# Patient Record
Sex: Female | Born: 2000 | Race: Black or African American | Hispanic: No | Marital: Single | Smoking: Never smoker
Health system: Southern US, Community
[De-identification: ages and names within clinical notes are randomized; demographics above are authoritative.]

## PROBLEM LIST (undated history)

## (undated) HISTORY — PX: UMBILICAL HERNIA REPAIR: SUR1181

---
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
## 2019-01-30 IMAGING — US US RENAL
1 series · 14 of 25 positions shown · non-contrast
Comparison: None.

CLINICAL DATA: Fevers, possible pyelonephritis

EXAM:
RENAL / URINARY TRACT ULTRASOUND COMPLETE

[Series 1: us renal · 0.24mm/px · 14 of 39 slices shown]
[im 1/39]
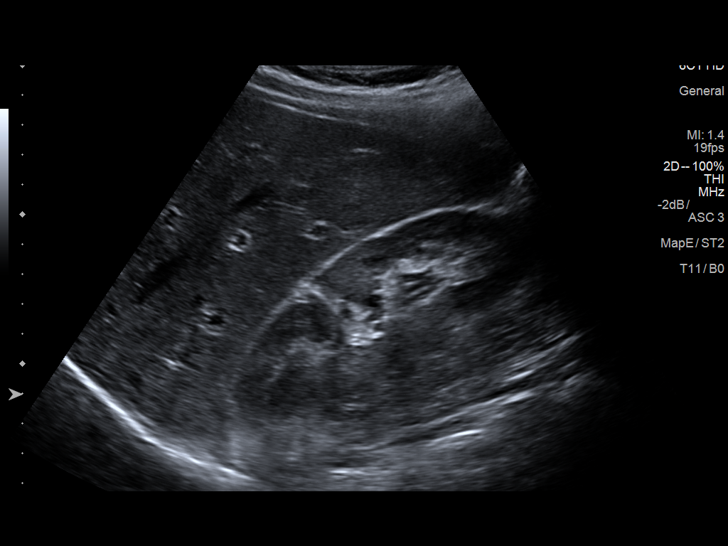
[im 4/39]
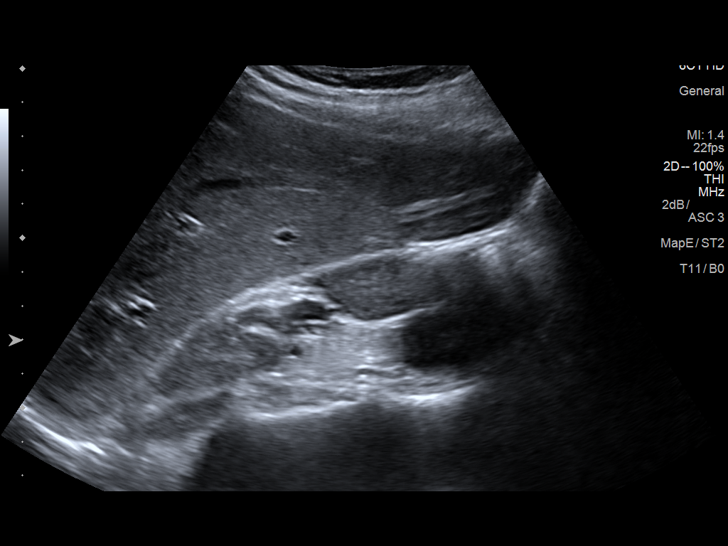
[im 7/39]
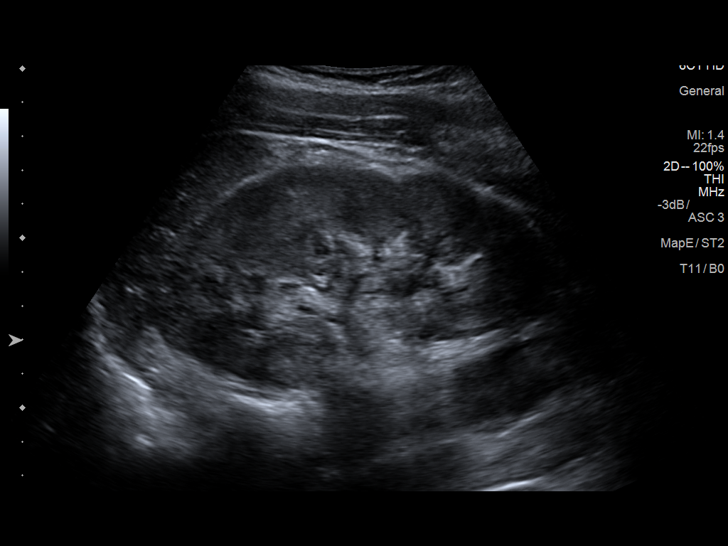
[im 10/39]
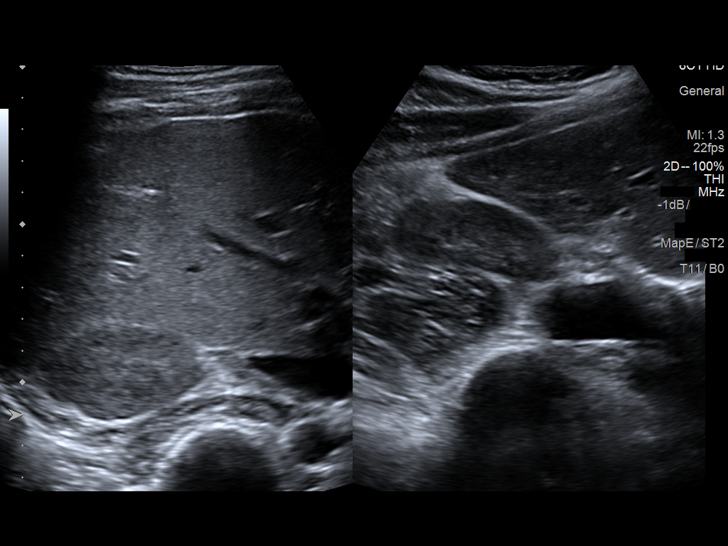
[im 13/39]
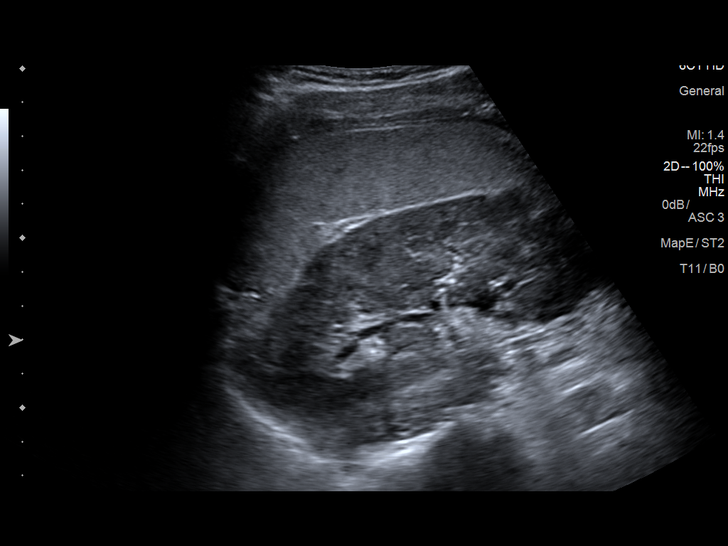
[im 15/39]
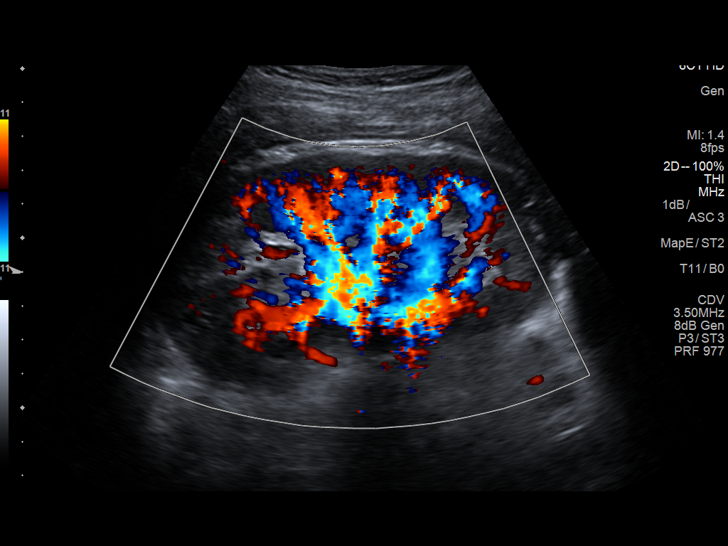
[im 18/39]
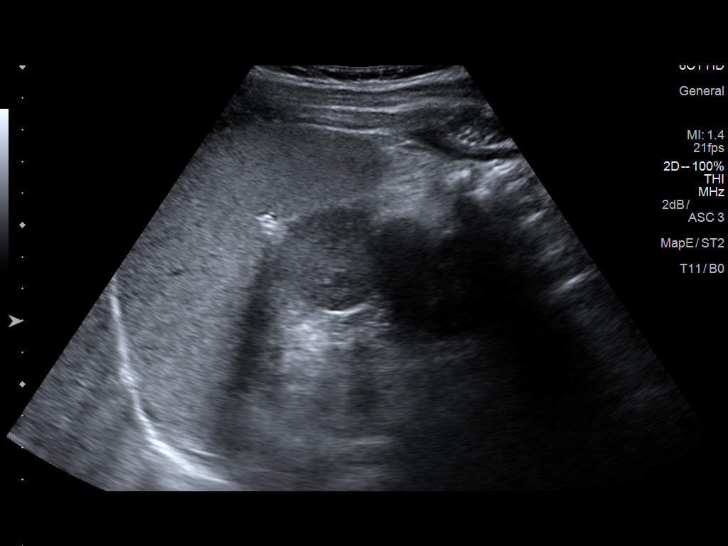
[im 21/39]
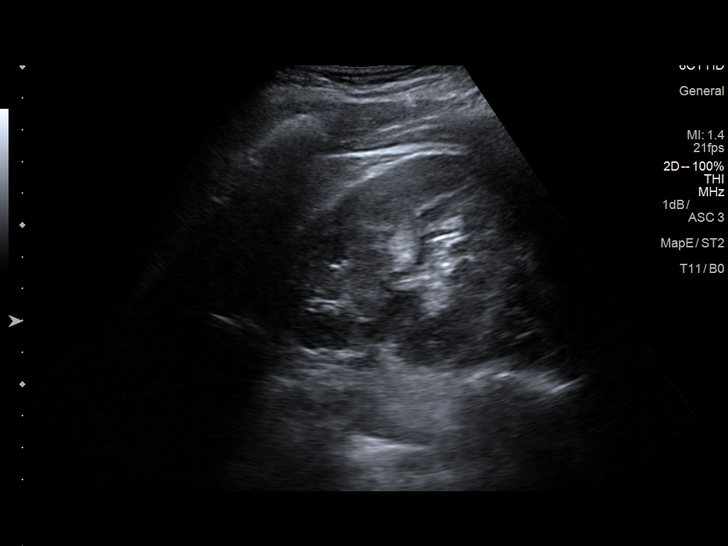
[im 24/39]
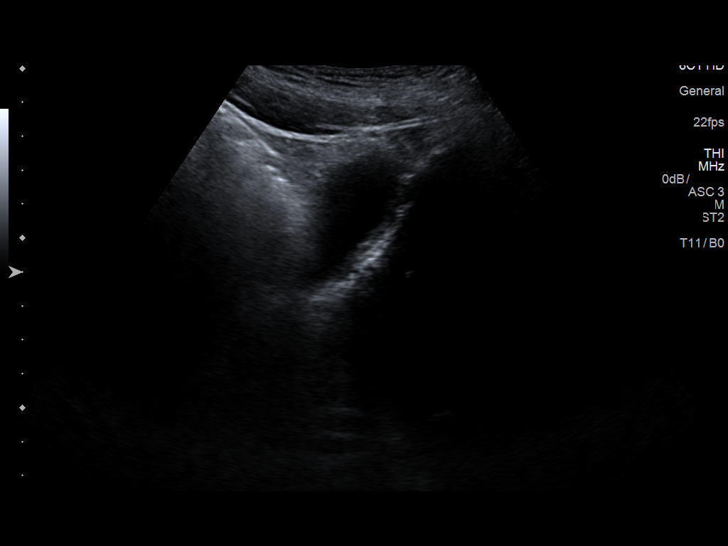
[im 26/39]
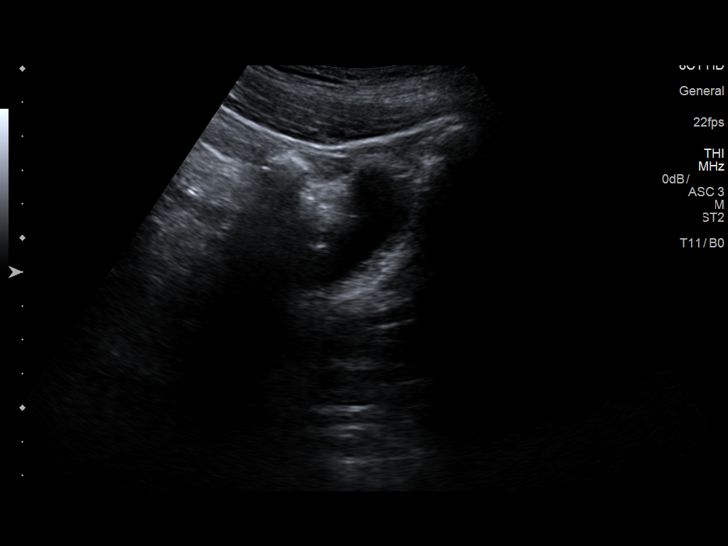
[im 29/39]
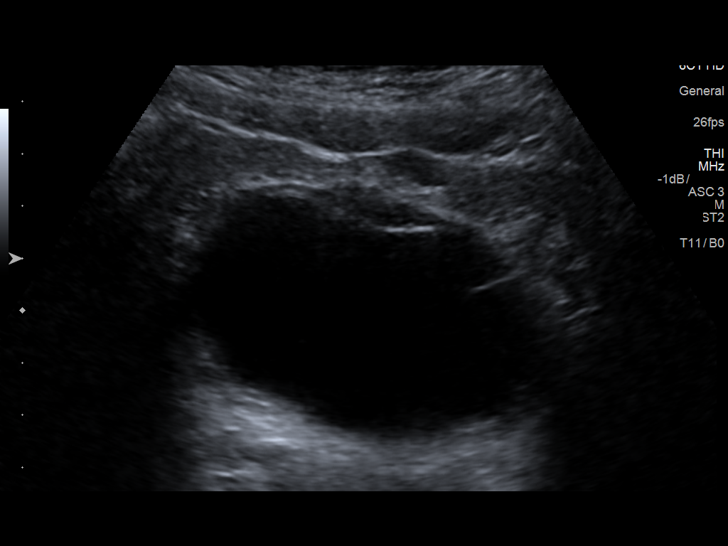
[im 32/39]
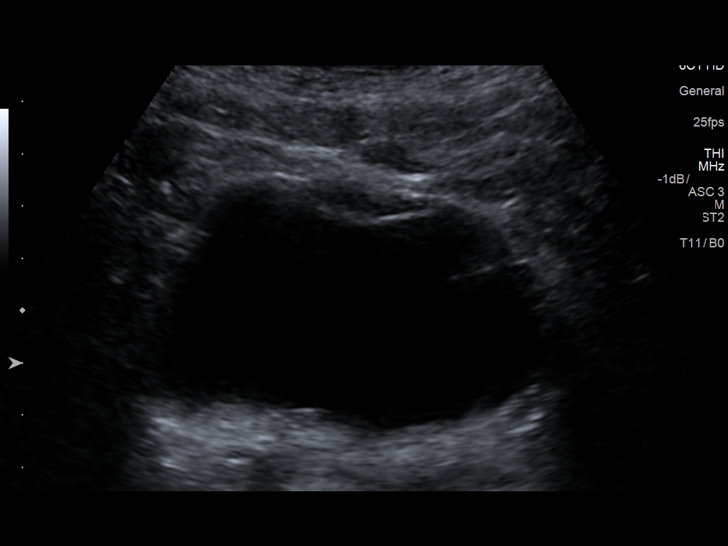
[im 35/39]
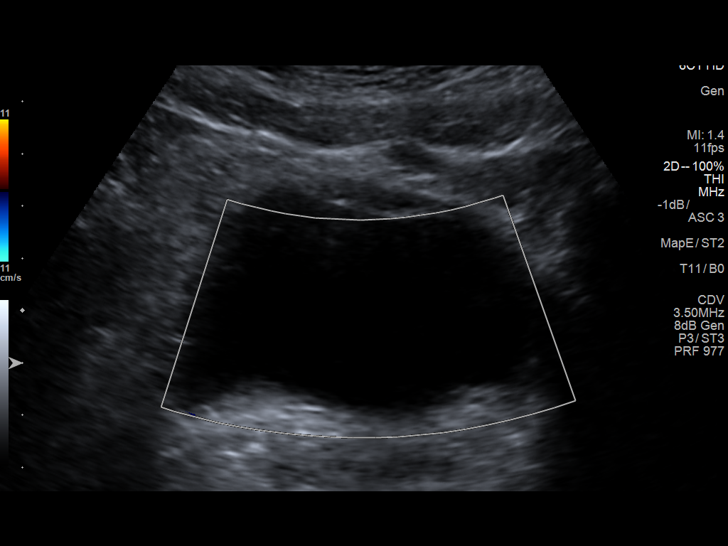
[im 39/39]
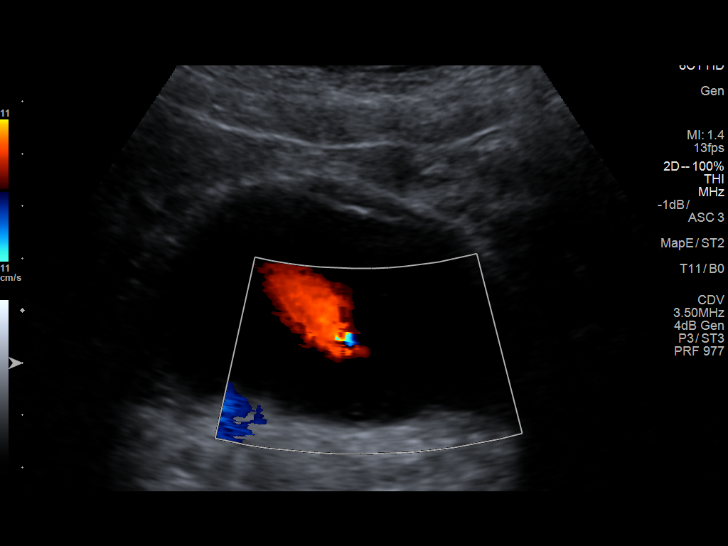

[14 of 25 positions shown; findings below may reference images not displayed]

FINDINGS: Right Kidney:

Length: 12 cm. Echogenicity within normal limits. No mass or
hydronephrosis visualized.

Left Kidney:

Length: 11.6 cm. Echogenicity within normal limits. No mass or
hydronephrosis visualized.

Bladder:

Appears normal for degree of bladder distention.
IMPRESSION: No acute abnormality noted.
